# Patient Record
Sex: Male | Born: 1950 | Race: Black or African American | Hispanic: No | Smoking: Current some day smoker
Health system: Southern US, Community
[De-identification: ages and names within clinical notes are randomized; demographics above are authoritative.]

---

## 2009-12-09 ENCOUNTER — Emergency Department (HOSPITAL_COMMUNITY): Admission: EM | Admit: 2009-12-09 | Discharge: 2009-12-09 | Payer: Self-pay | Admitting: Emergency Medicine

## 2010-08-25 LAB — DIFFERENTIAL
Basophils Absolute: 0.1 10*3/uL (ref 0.0–0.1)
Basophils Relative: 0 % (ref 0–1)
Eosinophils Relative: 0 % (ref 0–5)
Lymphs Abs: 1.6 10*3/uL (ref 0.7–4.0)
Monocytes Absolute: 0.7 10*3/uL (ref 0.1–1.0)
Monocytes Relative: 6 % (ref 3–12)

## 2010-08-25 LAB — URINALYSIS, ROUTINE W REFLEX MICROSCOPIC
Bilirubin Urine: NEGATIVE
Glucose, UA: NEGATIVE mg/dL
Specific Gravity, Urine: 1.016 (ref 1.005–1.030)
pH: 6 (ref 5.0–8.0)

## 2010-08-25 LAB — CBC
MCH: 31.3 pg (ref 26.0–34.0)
MCHC: 34.1 g/dL (ref 30.0–36.0)
Platelets: 160 10*3/uL (ref 150–400)
RBC: 5.56 MIL/uL (ref 4.22–5.81)
RDW: 14.5 % (ref 11.5–15.5)

## 2010-08-25 LAB — POCT CARDIAC MARKERS
CKMB, poc: <1 ng/mL — ABNORMAL LOW (ref 1.0–8.0)
Myoglobin, poc: 89.7 ng/mL (ref 12–200)
Troponin i, poc: <0.05 ng/mL (ref 0.00–0.09)

## 2010-08-25 LAB — BASIC METABOLIC PANEL
Chloride: 100 mEq/L (ref 96–112)
Glucose, Bld: 139 mg/dL — ABNORMAL HIGH (ref 70–99)

## 2011-01-18 IMAGING — CR DG CHEST 2V
2 series · 2 of 2 positions shown · non-contrast
Comparison: None.

CLINICAL DATA: Hypertension, headache, nausea, smoking history

CHEST - 2 VIEW

[w chest pa]
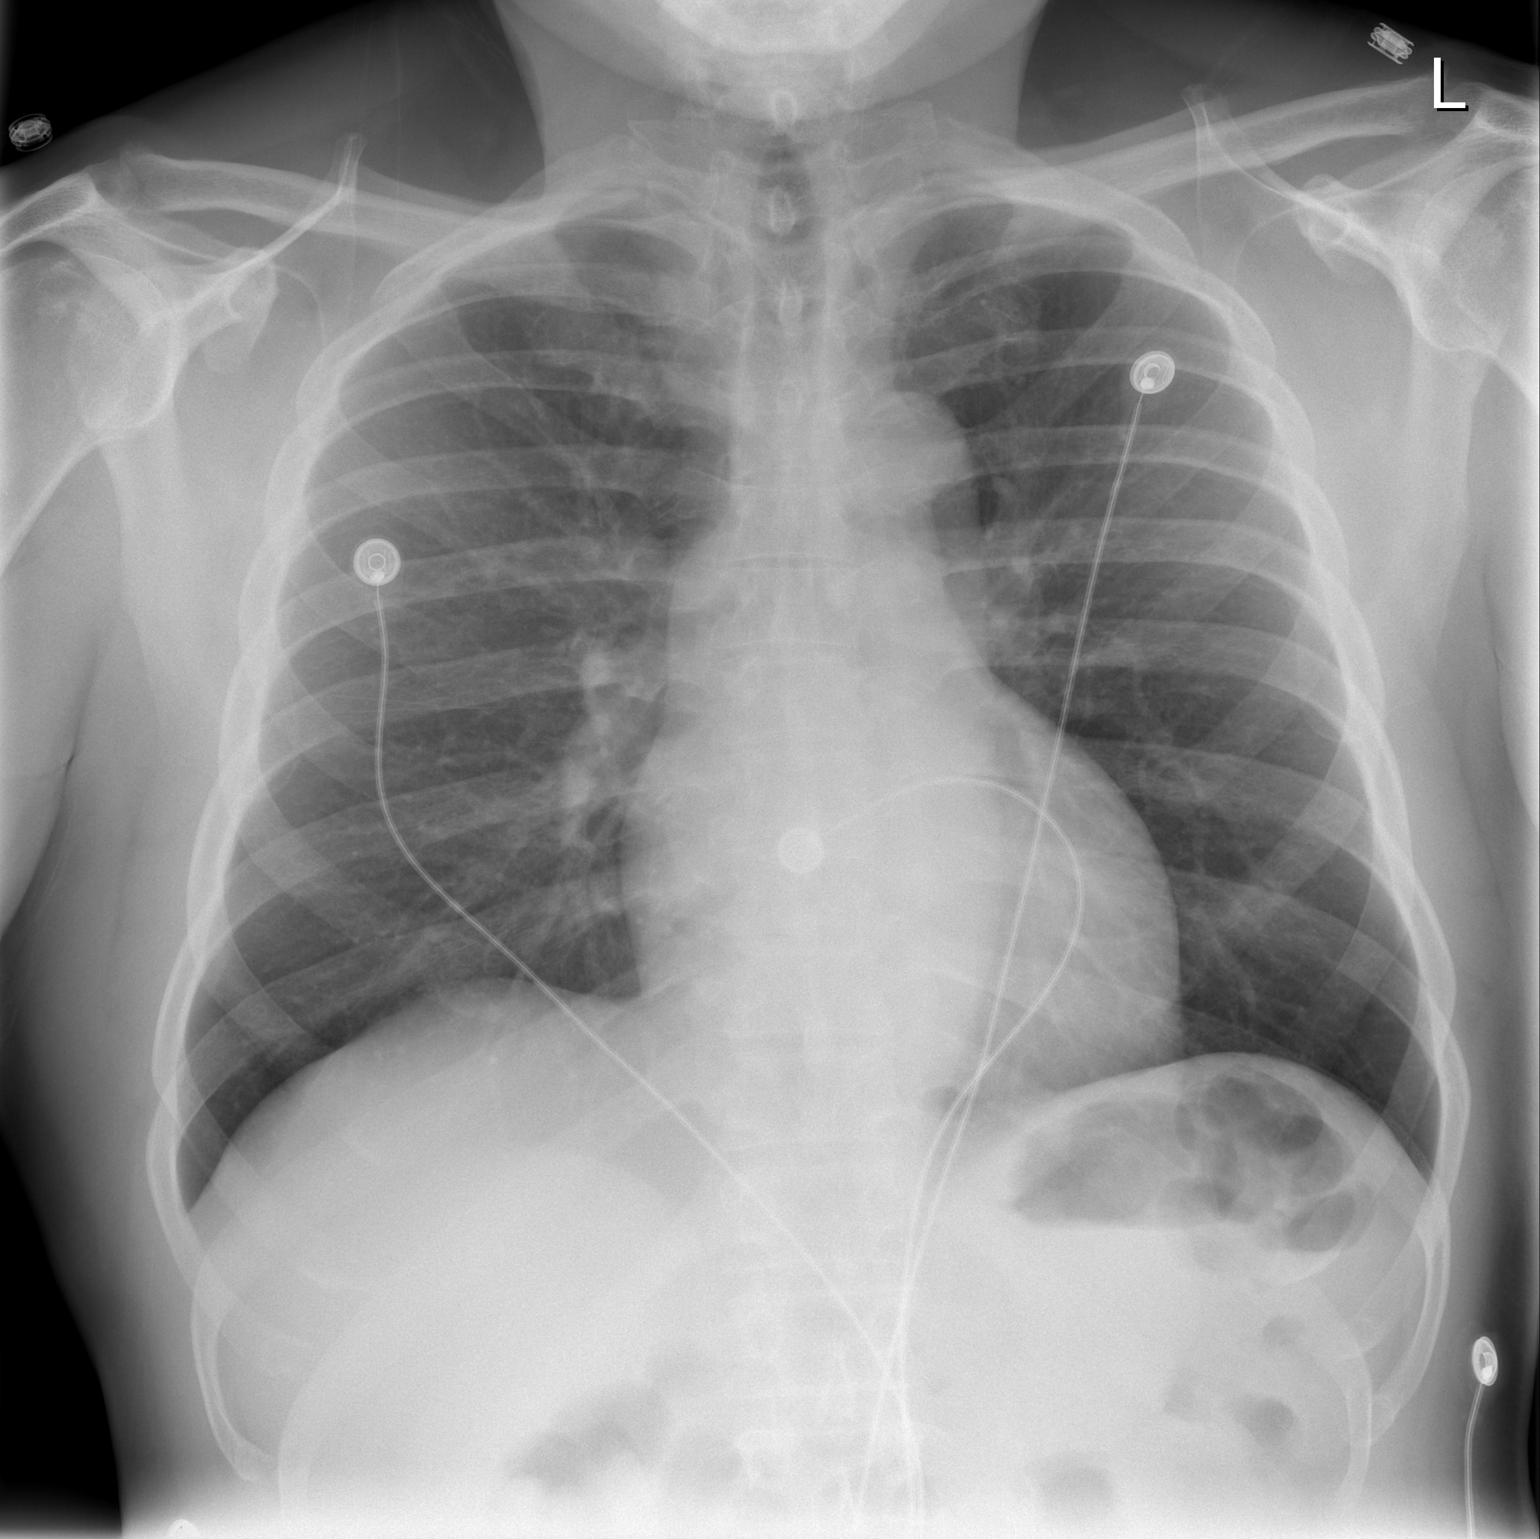

[w chest lat]
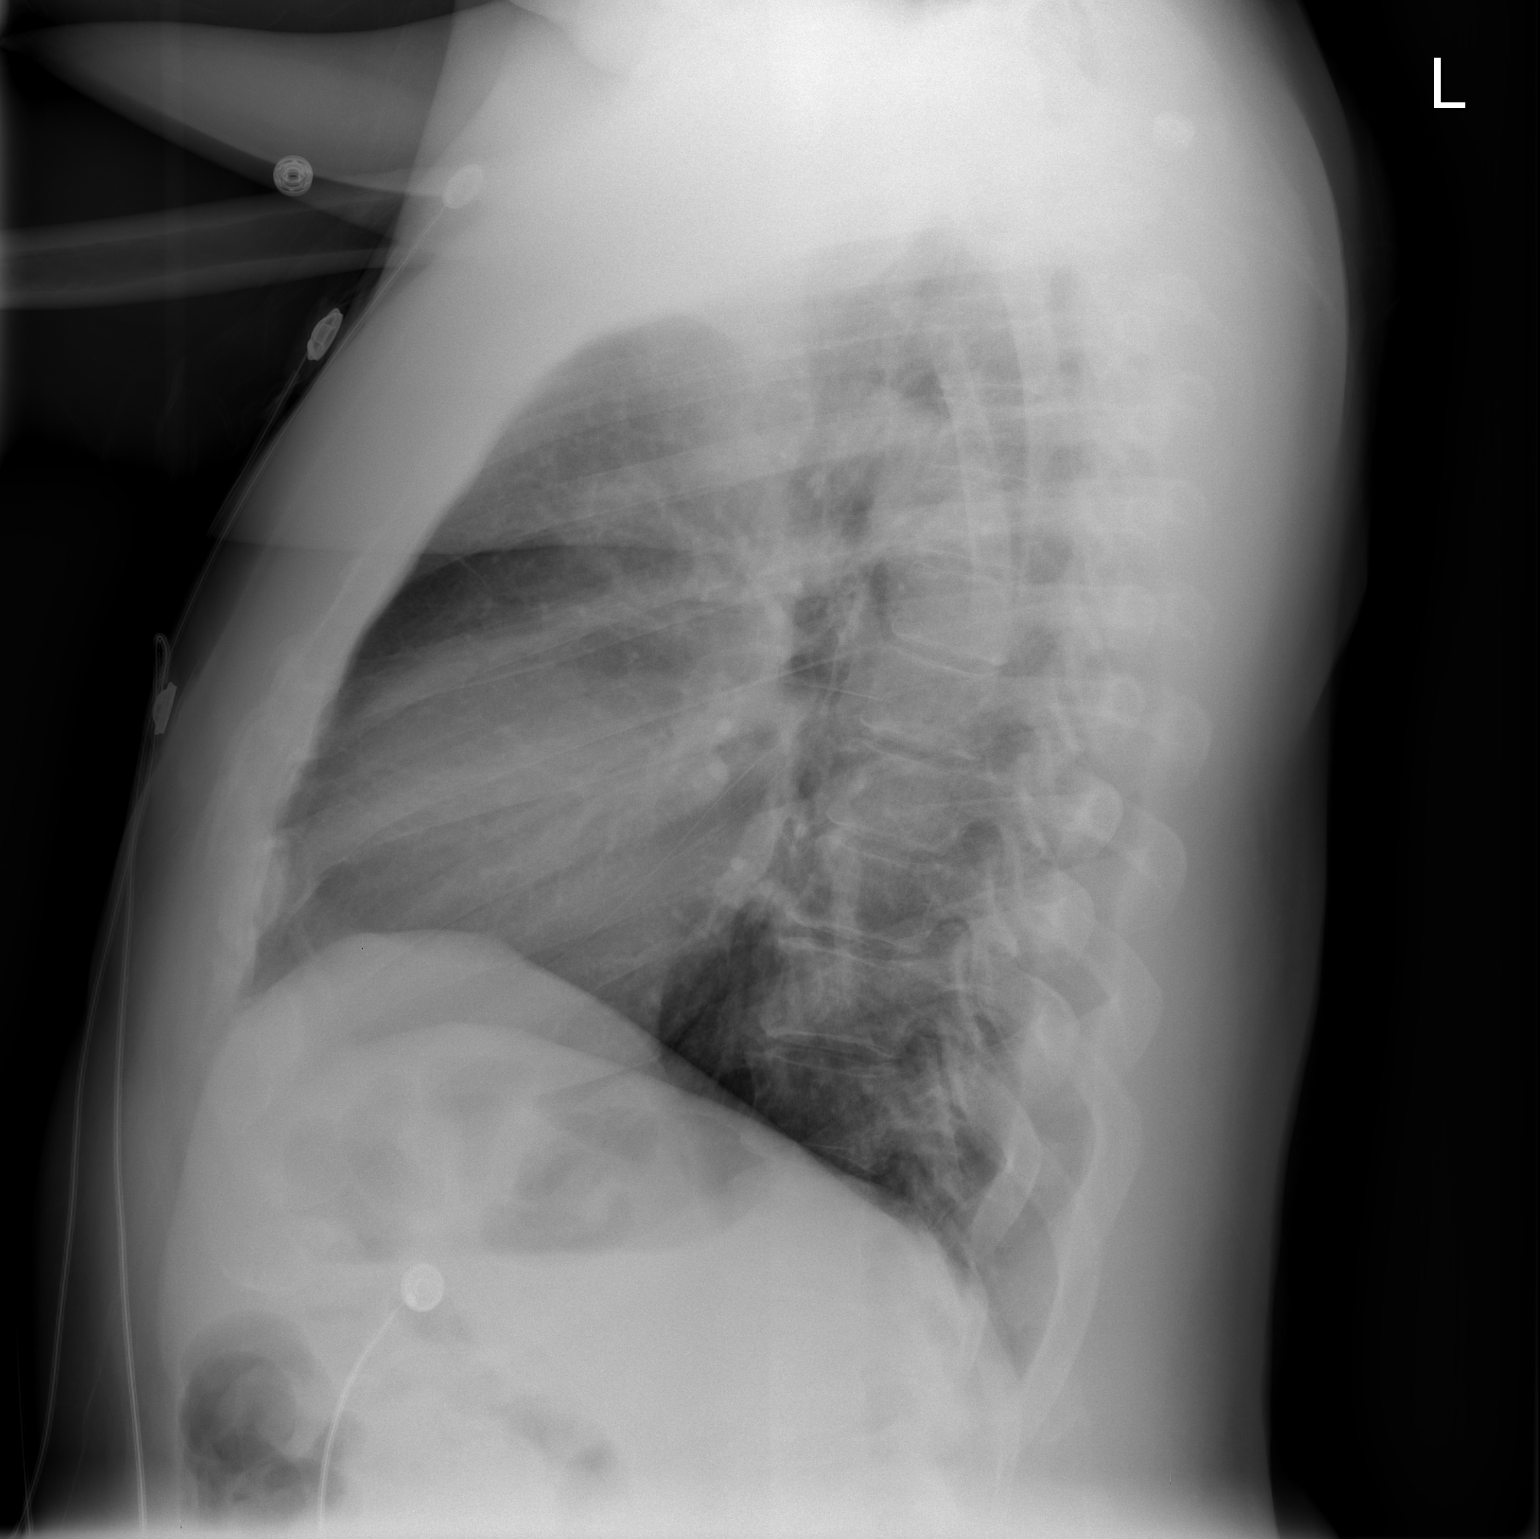

[2 of 2 positions shown; findings below may reference images not displayed]

FINDINGS: The lungs are clear.  Mediastinal contours are normal.
The heart is within normal limits in size.  No bony abnormality is
seen.
IMPRESSION: No active lung disease.

## 2011-01-18 IMAGING — CT CT HEAD W/O CM
1 series · 16 of 30 positions shown, 20 images · non-contrast
Comparison: None.

CLINICAL DATA: Headache, nausea for 4 days, high blood pressure

CT HEAD WITHOUT CONTRAST
TECHNIQUE: Contiguous axial images were obtained from the base of
the skull through the vertex without contrast.

[Series 2: headseq 4.8 h45s · axial · 0.43mm/px · z∈[-179,-27]mm · 16 of 36 slices shown, 20 images]
[im 2/36  brain]
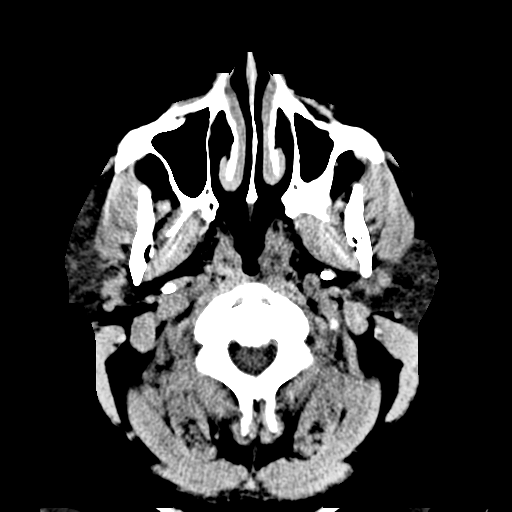
[im 2/36  bone]
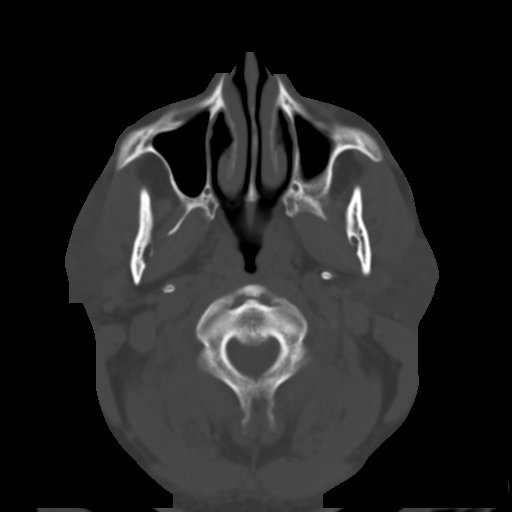
[im 4/36  brain]
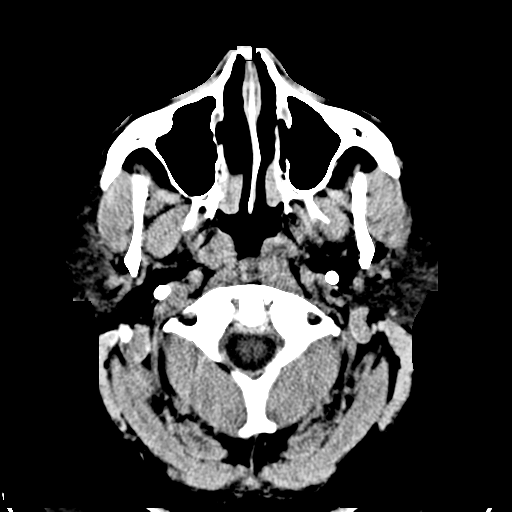
[im 7/36  brain]
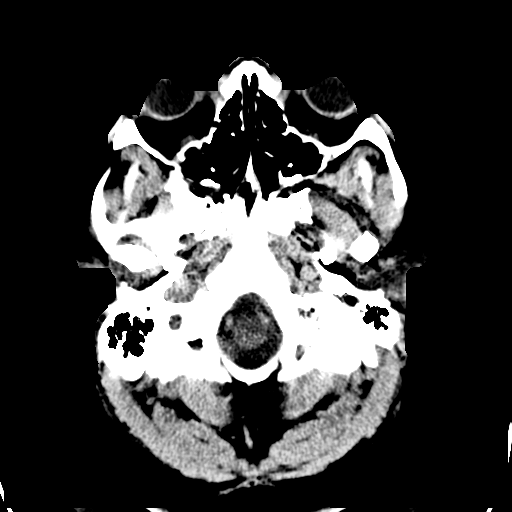
[im 9/36  brain]
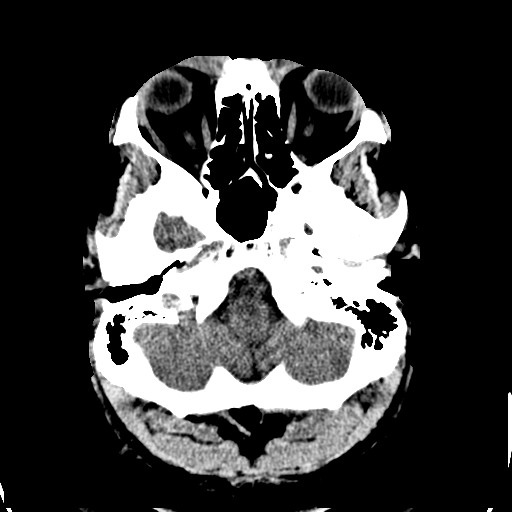
[im 10/36  brain]
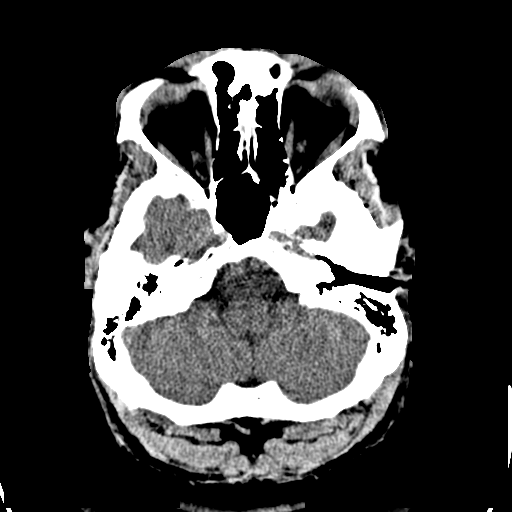
[im 10/36  bone]
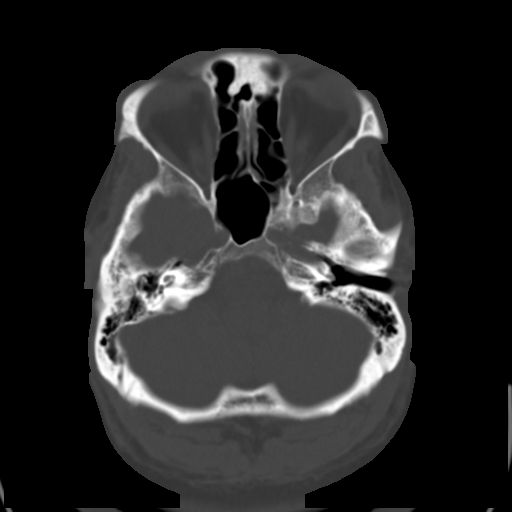
[im 13/36  brain]
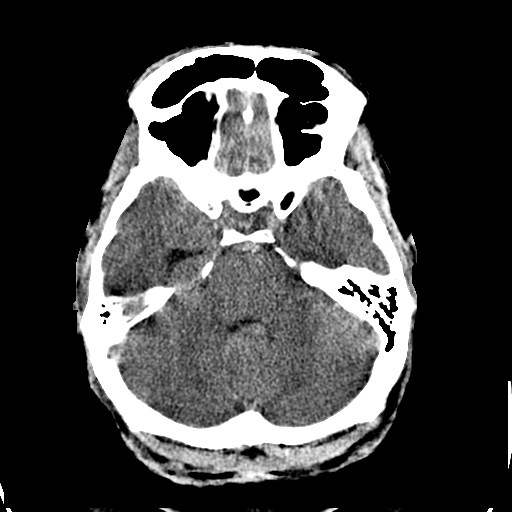
[im 15/36  brain]
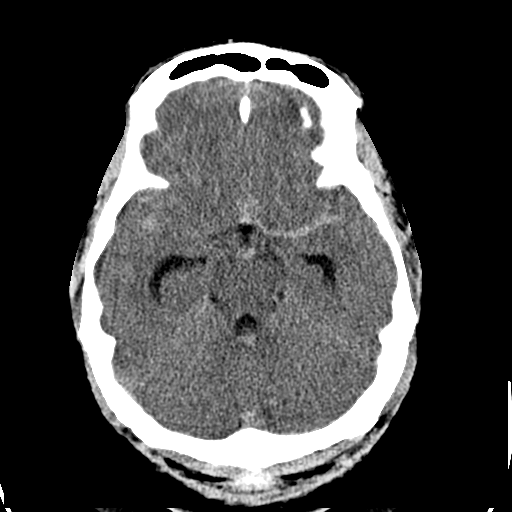
[im 17/36  brain]
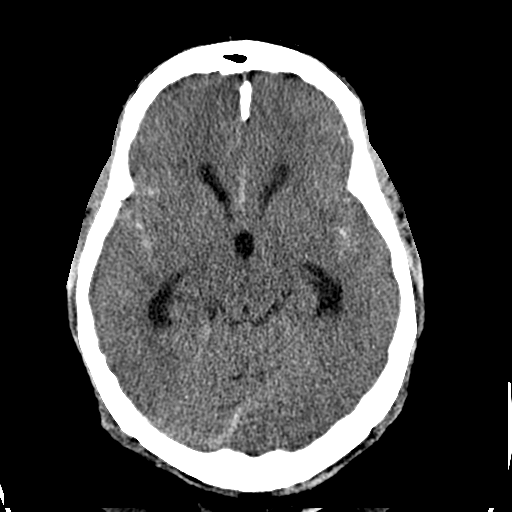
[im 19/36  brain]
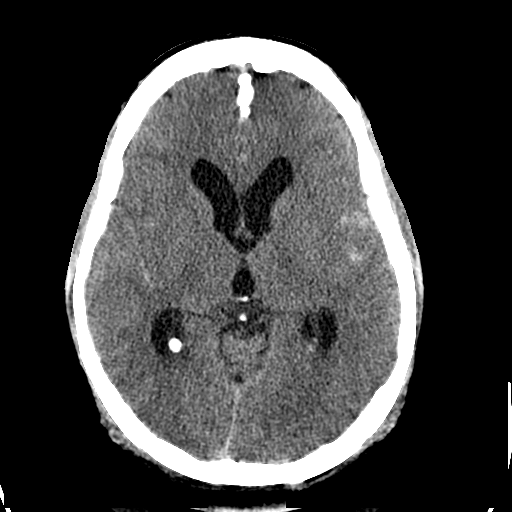
[im 19/36  bone]
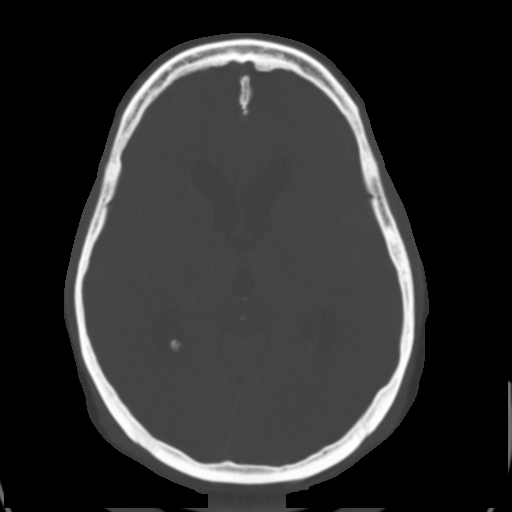
[im 21/36  brain]
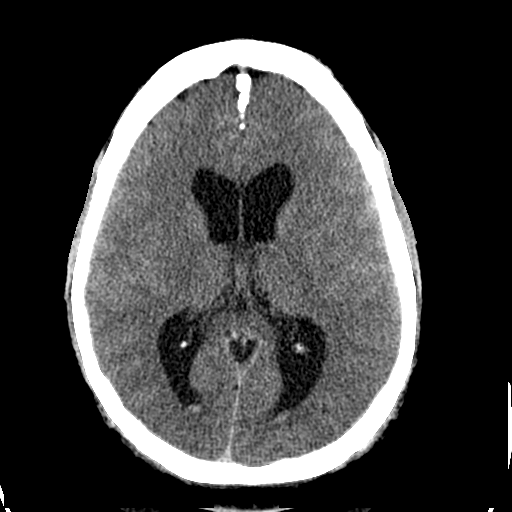
[im 23/36  brain]
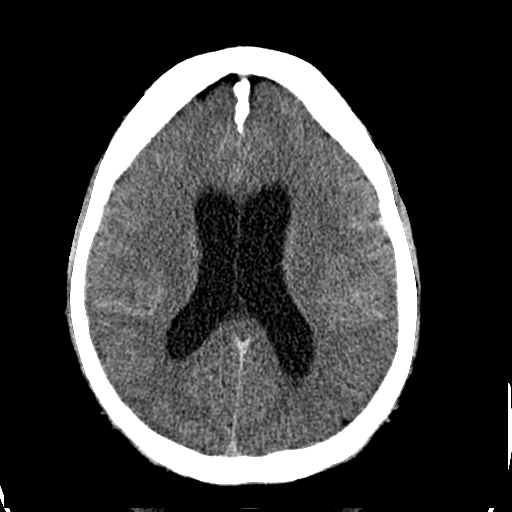
[im 26/36  brain]
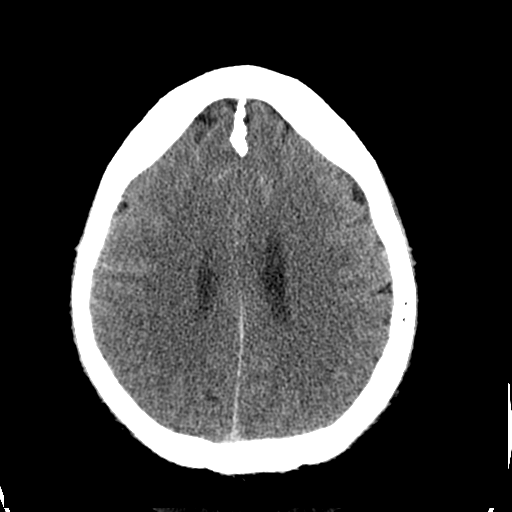
[im 27/36  brain]
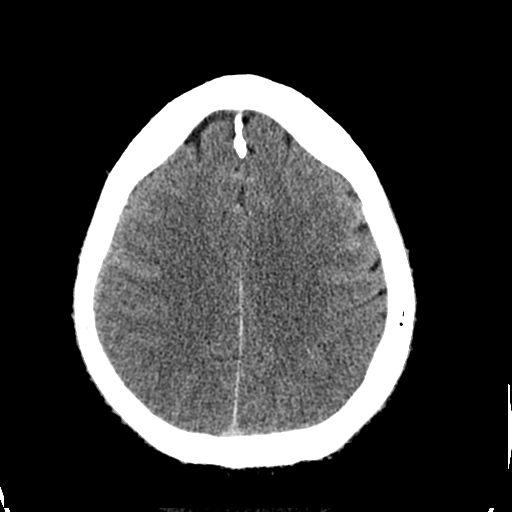
[im 27/36  bone]
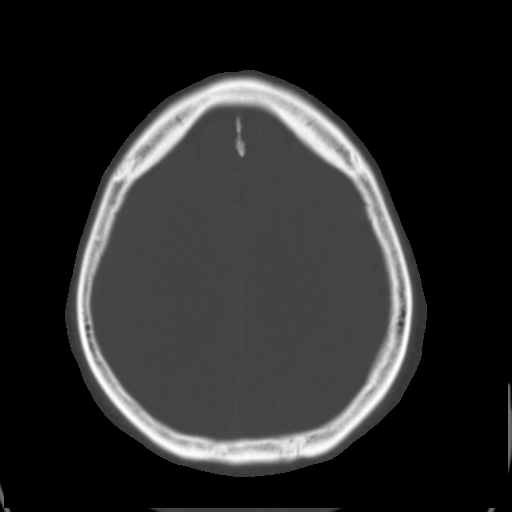
[im 29/36  brain]
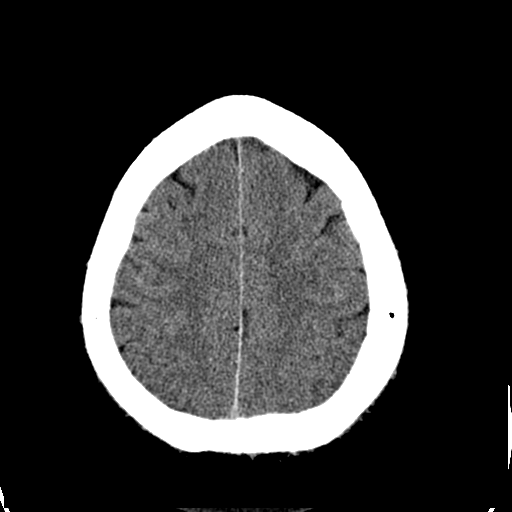
[im 32/36  brain]
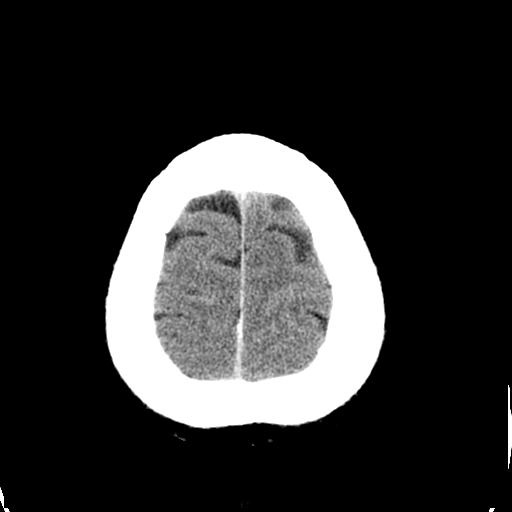
[im 34/36  brain]
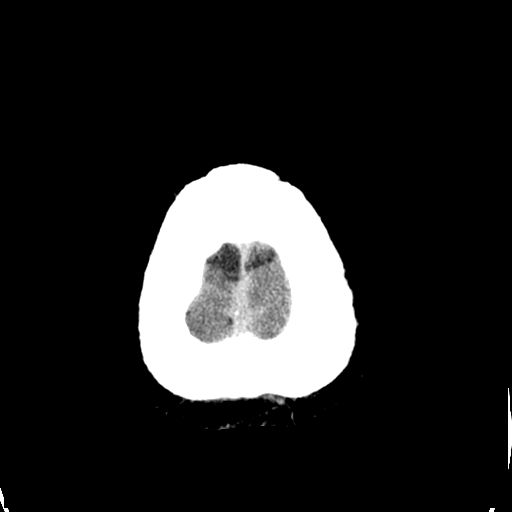

[16 of 30 positions shown; findings below may reference images not displayed]

FINDINGS: There is acute subarachnoid blood present.  Some blood
clusters anteriorly in the midline which may represent a ruptured
anterior cerebral artery aneurysm.  Some intraventricular blood is
present and the ventricles are slightly prominent.  The septum is
midline in position.  On bone window images no calvarial
abnormality is seen.
IMPRESSION: 1.  Acute subarachnoid blood suspicious for ruptured aneurysm
possibly in the region of the anterior cerebral artery.
2.  Some intraventricular hemorrhage.  Slight dilatation of the
ventricles.

Critical test results telephoned to Dr. Juana at the time of

## 2020-08-14 ENCOUNTER — Ambulatory Visit
Admission: EM | Admit: 2020-08-14 | Discharge: 2020-08-14 | Disposition: A | Discharge status: Home / Self Care | Payer: Medicare Other

## 2020-08-14 ENCOUNTER — Other Ambulatory Visit: Payer: Self-pay

## 2020-08-14 DIAGNOSIS — M25512 Pain in left shoulder: Secondary | ICD-10-CM

## 2020-08-14 DIAGNOSIS — T148XXA Other injury of unspecified body region, initial encounter: Secondary | ICD-10-CM | POA: Diagnosis not present

## 2020-08-14 DIAGNOSIS — M79605 Pain in left leg: Secondary | ICD-10-CM | POA: Diagnosis not present

## 2020-08-14 MED ORDER — TIZANIDINE HCL 4 MG PO TABS: 4.0000 mg | ORAL_TABLET | Freq: Four times a day (QID) | ORAL | 0 refills | Status: AC | PRN
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
## 2020-08-14 NOTE — Discharge Instructions (Signed)
I have sent in tizanidine for you to take twice a day as needed for muscle spasms  May take ibuprofen and tylenol as needed for pain  Follow up with orthopedics if symptoms persist
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
